# Patient Record
Sex: Male | Born: 1955
Health system: Southern US, Community
[De-identification: ages and names within clinical notes are randomized; demographics above are authoritative.]

---

## 2013-07-26 ENCOUNTER — Ambulatory Visit: Payer: Self-pay | Admitting: Urology

## 2013-08-19 ENCOUNTER — Ambulatory Visit: Payer: Self-pay | Admitting: Urology

## 2013-08-19 DIAGNOSIS — E669 Obesity, unspecified: Secondary | ICD-10-CM

## 2013-08-19 DIAGNOSIS — Z01810 Encounter for preprocedural cardiovascular examination: Secondary | ICD-10-CM

## 2013-08-19 LAB — BASIC METABOLIC PANEL
Anion Gap: 8 (ref 7–16)
BUN: 14 mg/dL (ref 7–18)
CALCIUM: 8.8 mg/dL (ref 8.5–10.1)
CHLORIDE: 102 mmol/L (ref 98–107)
CREATININE: 1.21 mg/dL (ref 0.60–1.30)
Co2: 28 mmol/L (ref 21–32)
EGFR (African American): >60
GLUCOSE: 109 mg/dL — AB (ref 65–99)
Osmolality: 277 (ref 275–301)
POTASSIUM: 4.2 mmol/L (ref 3.5–5.1)
SODIUM: 138 mmol/L (ref 136–145)

## 2013-08-26 ENCOUNTER — Ambulatory Visit: Payer: Self-pay | Admitting: Urology

## 2013-09-18 ENCOUNTER — Ambulatory Visit: Payer: Self-pay | Admitting: Specialist

## 2014-05-24 NOTE — Op Note (Signed)
PATIENT NAME:  Walter Hill, [name] MR#:  [id number] DATE OF BIRTH:  09-Jan-1956  DATE OF PROCEDURE:  08/26/2013  PREOPERATIVE DIAGNOSIS: Large left hydrocele.   POSTOPERATIVE DIAGNOSIS: Large left hydrocele.   PROCEDURE: Left hydrocelectomy, 400 mL hydrocele.   SURGEON: Denijah Karrer D. Edwyna ShellHart, DO.  ANESTHESIA: General with local, 0.5% plain Marcaine for the skin and cord.   DESCRIPTION OF PROCEDURE: With the patient sterilely prepped and draped in supine position after an appropriate timeout I injected the incision area along the area of the median raphe with about 4 mL of 0.5% plain Marcaine. I opened the skin with a #15 Bard-Parker blade. Then with cautery I carried the incision down to what appeared to be a large infectious  hydrocele. I opened the hydrocele, drained the fluid and suctioned.  I brought the testicle into the field, imbricated the hydrocele edges behind the testicle with a running 3-0 Vicryl suture. I placed a drain dependently, a 1-inch Penrose drain, sutured in with 3-0 Ethibond. I then closed the incision with a running 3-0 Vicryl. Electrocautery controlled any minimal bleeding. There was 0-5 mL blood loss. I resutured the tunica vaginalis and reapproximated it and then with this continuous suture I reapproximated the subcuticular tissue and a subcuticular closure for the incision.  I used Dermabond on the surface. The patient was then sent to recovery in satisfactory condition.    ____________________________ Caralyn Guileichard D. Edwyna ShellHart, DO rdh:lt D: 08/26/2013 09:42:28 ET T: 08/26/2013 10:11:33 ET JOB#: [id number]  cc: Caralyn Guileichard D. Edwyna ShellHart, DO, <Dictator> Brock Larmon D Ozias Dicenzo DO ELECTRONICALLY SIGNED 08/29/2013 14:07

## 2016-06-15 IMAGING — CR DG CHEST 2V
1 series · 2 of 2 positions shown · non-contrast
Comparison: None.

CLINICAL DATA: Preoperative chest x-ray

EXAM:
CHEST  2 VIEW

[Series 1: w chest pa · 0.14mm/px · 2 of 2 slices shown]
[im 1/2]
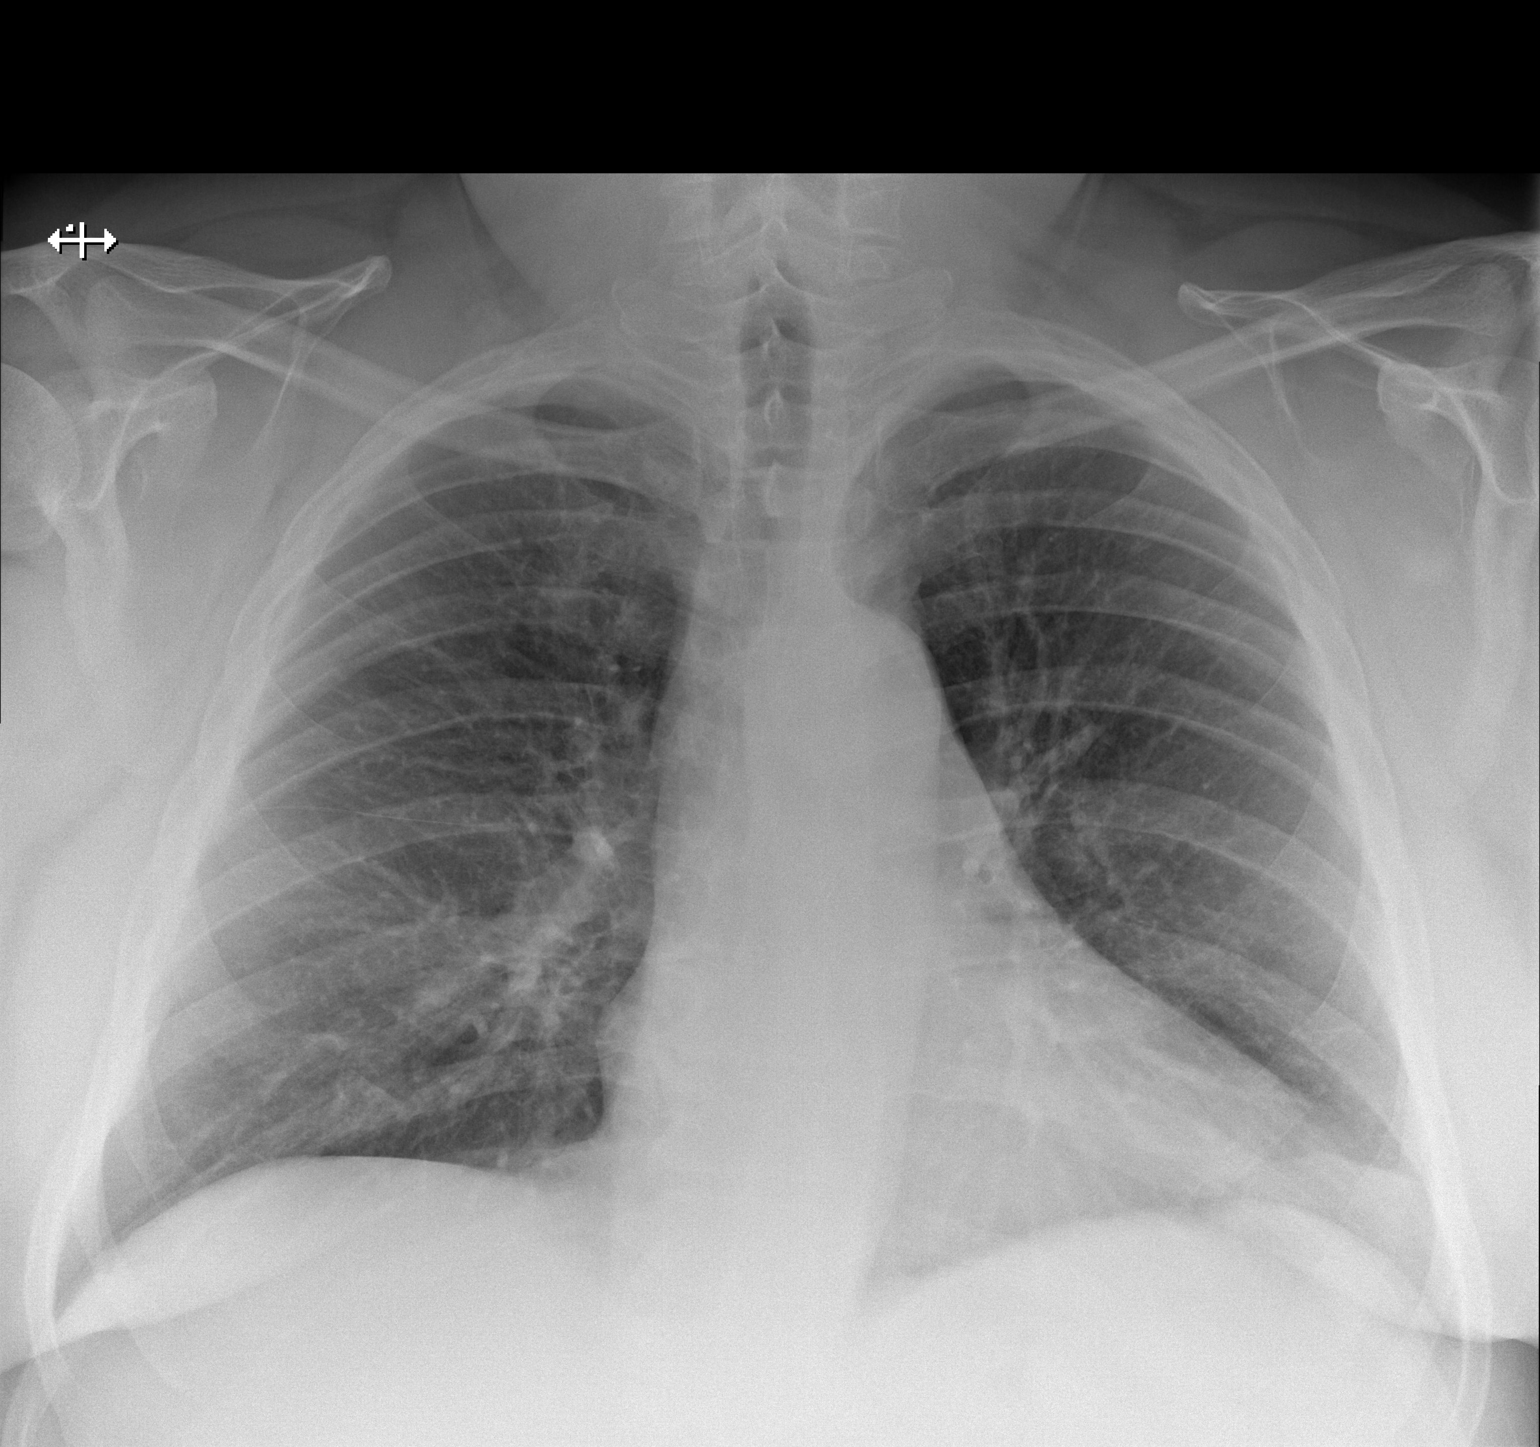
[im 2/2]
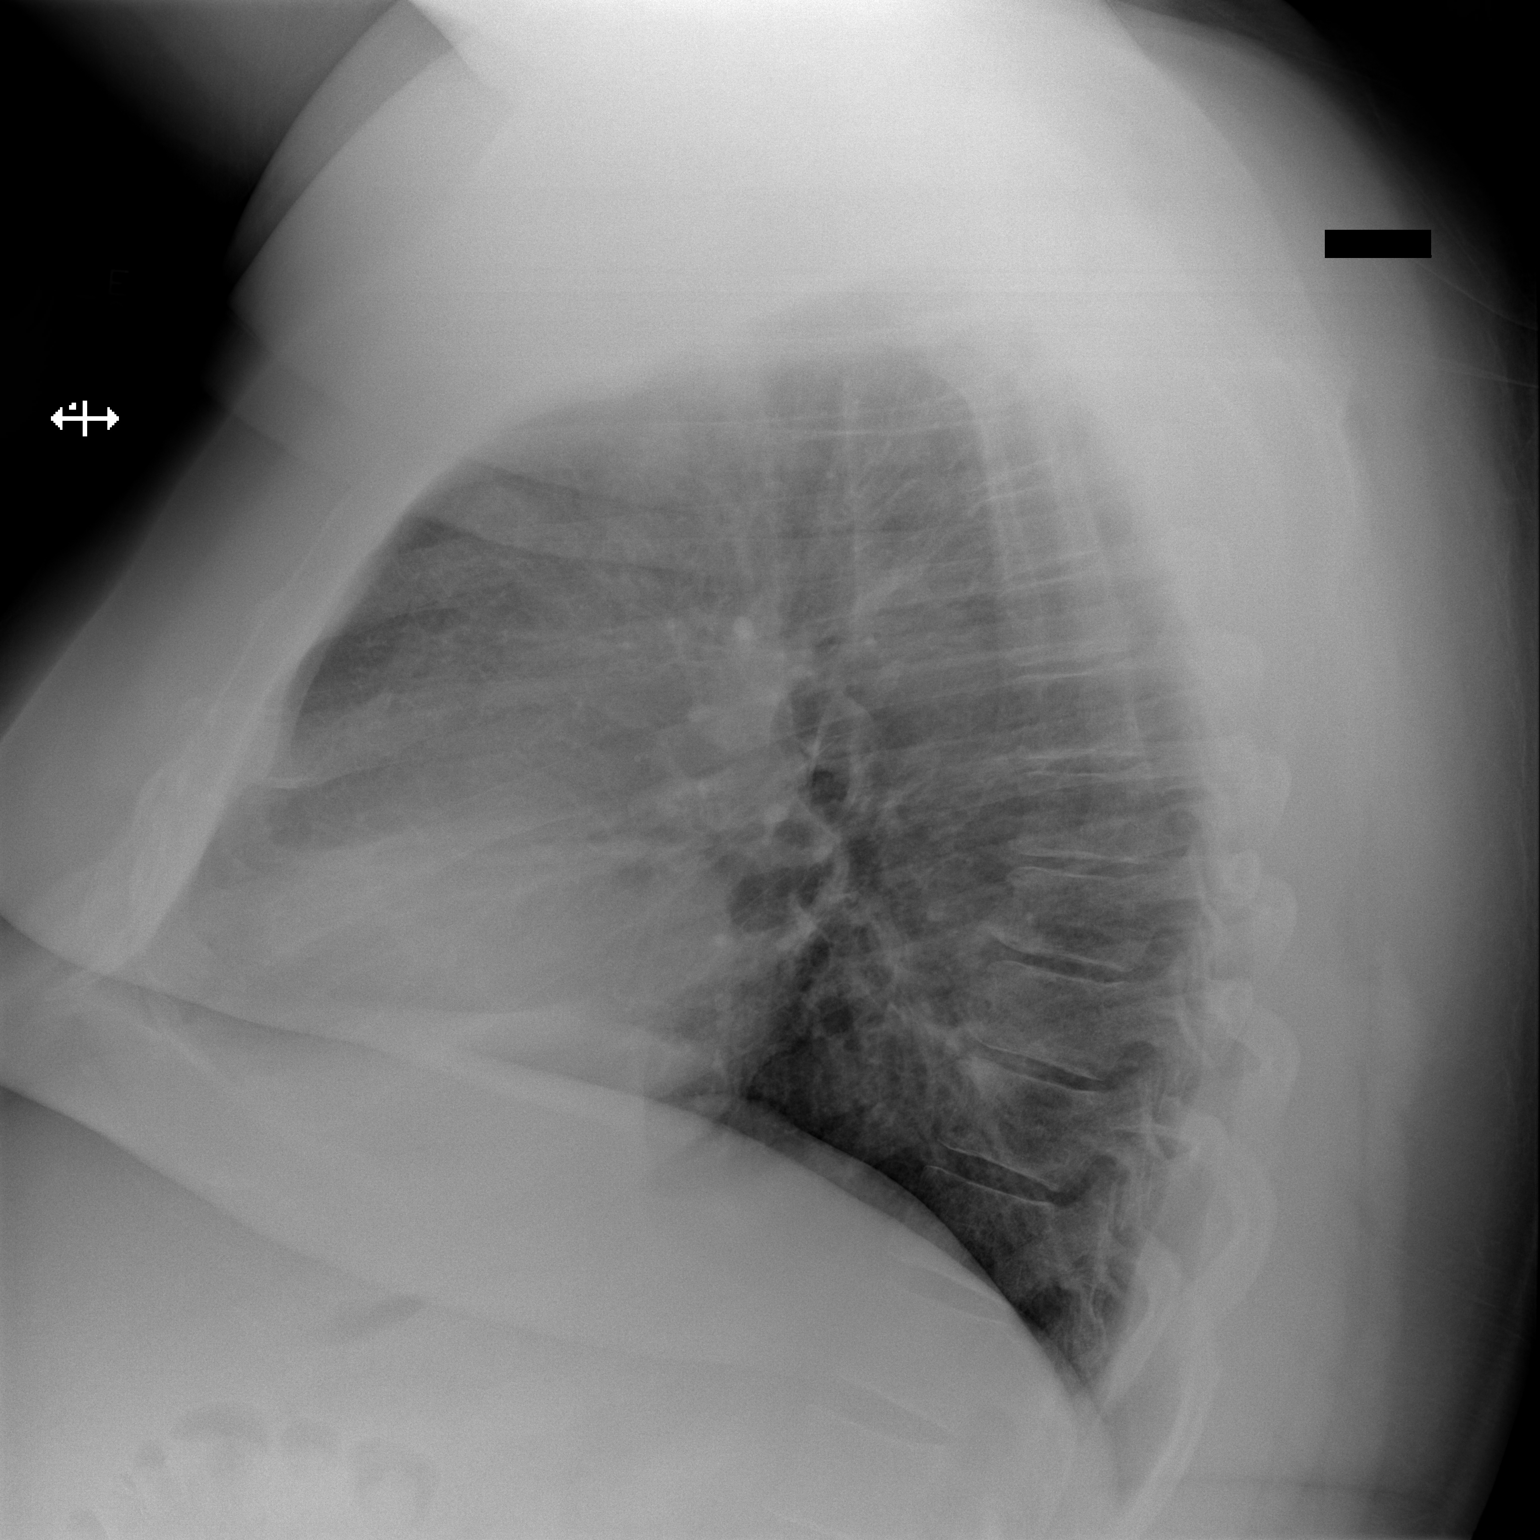

[2 of 2 positions shown; findings below may reference images not displayed]

FINDINGS: Cardiac and mediastinal contours are within normal limits. The lungs
are well expanded. No focal airspace consolidation, pleural effusion
or pneumothorax. No suspicious pulmonary nodule or mass. No acute
osseous abnormality.
IMPRESSION: No active cardiopulmonary disease.

## 2023-10-18 ENCOUNTER — Ambulatory Visit

## 2023-10-18 DIAGNOSIS — Z1211 Encounter for screening for malignant neoplasm of colon: Secondary | ICD-10-CM | POA: Diagnosis present

## 2023-10-18 DIAGNOSIS — D123 Benign neoplasm of transverse colon: Secondary | ICD-10-CM | POA: Diagnosis not present

## 2024-02-14 ENCOUNTER — Ambulatory Visit

## 2024-02-14 DIAGNOSIS — D122 Benign neoplasm of ascending colon: Secondary | ICD-10-CM | POA: Diagnosis not present

## 2024-02-14 DIAGNOSIS — D124 Benign neoplasm of descending colon: Secondary | ICD-10-CM | POA: Diagnosis not present

## 2024-02-14 DIAGNOSIS — K621 Rectal polyp: Secondary | ICD-10-CM | POA: Diagnosis not present

## 2024-02-14 DIAGNOSIS — Z1211 Encounter for screening for malignant neoplasm of colon: Secondary | ICD-10-CM | POA: Diagnosis present

## 2024-02-14 DIAGNOSIS — K5730 Diverticulosis of large intestine without perforation or abscess without bleeding: Secondary | ICD-10-CM | POA: Diagnosis not present

## 2024-02-14 DIAGNOSIS — D123 Benign neoplasm of transverse colon: Secondary | ICD-10-CM | POA: Diagnosis not present

## 2024-02-14 DIAGNOSIS — K640 First degree hemorrhoids: Secondary | ICD-10-CM | POA: Diagnosis not present

## 2024-03-04 ENCOUNTER — Ambulatory Visit: Payer: Self-pay

## 2024-03-05 ENCOUNTER — Emergency Department

## 2024-03-05 ENCOUNTER — Emergency Department: Admission: EM | Admit: 2024-03-05 | Discharge: 2024-03-05 | Disposition: A | Discharge status: Home / Self Care

## 2024-03-05 ENCOUNTER — Other Ambulatory Visit: Payer: Self-pay

## 2024-03-05 DIAGNOSIS — N132 Hydronephrosis with renal and ureteral calculous obstruction: Secondary | ICD-10-CM | POA: Insufficient documentation

## 2024-03-05 DIAGNOSIS — N200 Calculus of kidney: Secondary | ICD-10-CM

## 2024-03-05 DIAGNOSIS — D72829 Elevated white blood cell count, unspecified: Secondary | ICD-10-CM | POA: Insufficient documentation

## 2024-03-05 LAB — URINALYSIS, ROUTINE W REFLEX MICROSCOPIC
Bacteria, UA: NONE SEEN
Bilirubin Urine: NEGATIVE
Glucose, UA: NEGATIVE mg/dL
Hgb urine dipstick: NEGATIVE
Ketones, ur: 20 mg/dL — AB
Leukocytes,Ua: NEGATIVE
Nitrite: NEGATIVE
Protein, ur: 30 mg/dL — AB
Specific Gravity, Urine: 1.027 (ref 1.005–1.030)
pH: 5 (ref 5.0–8.0)

## 2024-03-05 LAB — COMPREHENSIVE METABOLIC PANEL WITH GFR
ALT: 28 U/L (ref 0–44)
AST: 28 U/L (ref 15–41)
Albumin: 4 g/dL (ref 3.5–5.0)
Alkaline Phosphatase: 93 U/L (ref 38–126)
Anion gap: 15 (ref 5–15)
BUN: 25 mg/dL — ABNORMAL HIGH (ref 8–23)
CO2: 17 mmol/L — ABNORMAL LOW (ref 22–32)
Calcium: 9 mg/dL (ref 8.9–10.3)
Chloride: 103 mmol/L (ref 98–111)
Creatinine, Ser: 1.69 mg/dL — ABNORMAL HIGH (ref 0.61–1.24)
GFR, Estimated: 44 mL/min — ABNORMAL LOW
Glucose, Bld: 98 mg/dL (ref 70–99)
Potassium: 4.6 mmol/L (ref 3.5–5.1)
Sodium: 135 mmol/L (ref 135–145)
Total Bilirubin: 0.8 mg/dL (ref 0.0–1.2)
Total Protein: 7.3 g/dL (ref 6.5–8.1)

## 2024-03-05 LAB — CBC
HCT: 45.1 % (ref 39.0–52.0)
Hemoglobin: 14.9 g/dL (ref 13.0–17.0)
MCH: 32.2 pg (ref 26.0–34.0)
MCHC: 33 g/dL (ref 30.0–36.0)
MCV: 97.4 fL (ref 80.0–100.0)
Platelets: 173 10*3/uL (ref 150–400)
RBC: 4.63 MIL/uL (ref 4.22–5.81)
RDW: 11.9 % (ref 11.5–15.5)
WBC: 14.3 10*3/uL — ABNORMAL HIGH (ref 4.0–10.5)
nRBC: 0 % (ref 0.0–0.2)

## 2024-03-05 LAB — LIPASE, BLOOD: Lipase: 21 U/L (ref 11–51)

## 2024-03-05 LAB — LACTIC ACID, PLASMA: Lactic Acid, Venous: 1.1 mmol/L (ref 0.5–1.9)

## 2024-03-05 MED ORDER — ONDANSETRON 4 MG PO TBDP
4.0000 mg | ORAL_TABLET | Freq: Once | ORAL | Status: AC
  Administered 2024-03-05: 4 mg via ORAL
  Filled 2024-03-05: qty 1

## 2024-03-05 MED ORDER — HYDROCODONE-ACETAMINOPHEN 5-325 MG PO TABS
1.0000 | ORAL_TABLET | ORAL | Status: AC
  Administered 2024-03-05: 1 via ORAL
  Filled 2024-03-05: qty 1

## 2024-03-05 MED ORDER — IOHEXOL 300 MG/ML  SOLN
80.0000 mL | Freq: Once | INTRAMUSCULAR | Status: AC | PRN
  Administered 2024-03-05: 80 mL via INTRAVENOUS

## 2024-03-05 MED ORDER — HYDROCODONE-ACETAMINOPHEN 5-325 MG PO TABS: 1.0000 | ORAL_TABLET | Freq: Three times a day (TID) | ORAL | 0 refills | Status: AC | PRN

## 2024-03-05 MED ORDER — ONDANSETRON 4 MG PO TBDP: 4.0000 mg | ORAL_TABLET | Freq: Three times a day (TID) | ORAL | 0 refills | Status: AC | PRN

## 2024-03-05 MED ORDER — TAMSULOSIN HCL 0.4 MG PO CAPS: 0.4000 mg | ORAL_CAPSULE | Freq: Every day | ORAL | 0 refills | Status: AC

## 2024-03-05 NOTE — ED Notes (Addendum)
 Discharge instructions reviewed.   Newly prescribed medications discussed. Pharmacy verified.   Opportunity for questions and concerns provided.   Alert, oriented and ambulatory. Spouse driving home.   Displays no signs of distress.   Urine strainer provided.

## 2024-03-05 NOTE — Discharge Instructions (Addendum)
 Please use strainer with urination.  Take medications as prescribed.  Make sure you are drinking lots of fluids to stay hydrated.  Call urology office tomorrow to schedule follow-up appointment return to the ER for any fevers increasing pain nausea vomiting worsening symptoms or any urgent changes in your health.
